# Patient Record
Sex: Male | Born: 1990 | Race: Black or African American | Hispanic: No | Marital: Single | Smoking: Never smoker
Health system: Southern US, Community
[De-identification: ages and names within clinical notes are randomized; demographics above are authoritative.]

---
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
## 2011-08-27 LAB — SAMPLE TO BLOOD BANK

## 2011-08-27 LAB — DIFFERENTIAL
Eosinophils Absolute: 0.1
Lymphs Abs: 0.9 — ABNORMAL LOW
Monocytes Relative: 6
Neutro Abs: 5.7
Neutrophils Relative %: 80 — ABNORMAL HIGH

## 2011-08-27 LAB — BASIC METABOLIC PANEL
CO2: 26
Calcium: 9.3
Glucose, Bld: 87
Potassium: 3.4 — ABNORMAL LOW

## 2011-08-27 LAB — URINALYSIS, ROUTINE W REFLEX MICROSCOPIC
Hgb urine dipstick: NEGATIVE
Nitrite: NEGATIVE
Protein, ur: NEGATIVE
Specific Gravity, Urine: 1.025
Urobilinogen, UA: 1

## 2011-08-27 LAB — CBC
HCT: 39.4
Hemoglobin: 13.7
MCV: 87.6
RBC: 4.49
RDW: 13.7
WBC: 7.1

## 2013-09-26 ENCOUNTER — Encounter (HOSPITAL_COMMUNITY): Payer: Self-pay | Admitting: Emergency Medicine

## 2013-09-26 ENCOUNTER — Emergency Department (INDEPENDENT_AMBULATORY_CARE_PROVIDER_SITE_OTHER)
Admission: EM | Admit: 2013-09-26 | Discharge: 2013-09-26 | Disposition: A | Discharge status: Home / Self Care | Payer: Self-pay | Source: Home / Self Care | Attending: Emergency Medicine | Admitting: Emergency Medicine

## 2013-09-26 DIAGNOSIS — J039 Acute tonsillitis, unspecified: Secondary | ICD-10-CM

## 2013-09-26 MED ORDER — AZITHROMYCIN 500 MG PO TABS: 500.0000 mg | ORAL_TABLET | Freq: Every day | ORAL | Status: DC

## 2013-09-26 MED ORDER — PREDNISONE 20 MG PO TABS: 20.0000 mg | ORAL_TABLET | Freq: Two times a day (BID) | ORAL | Status: DC

## 2013-09-26 NOTE — ED Notes (Signed)
C/o sore throat onset 2 days ago.  Noted white patches on his tonsils today. No fever.  Has a little runny nose. Had back pain the first day.

## 2013-09-26 NOTE — ED Provider Notes (Signed)
Chief Complaint:   Chief Complaint  Patient presents with  . Sore Throat    History of Present Illness:   Reginald Morris is a 22 year old male who's had a two-day history of sore throat, pain on swallowing, rhinorrhea, headache, and redness of the right eye. His wife is with him today with the same symptoms. She tested negative for strep. He denies any earache, swollen glands, coughing, wheezing, or GI symptoms. Other than his wife he's had no sick exposures and no definite exposure to strep or to mono.  Review of Systems:  Other than as noted above, the patient denies any of the following symptoms. Systemic:  No fever, chills, sweats, fatigue, myalgias, headache, or anorexia. Eye:  No redness, pain or drainage. ENT:  No earache, ear congestion, nasal congestion, sneezing, rhinorrhea, sinus pressure, sinus pain, or post nasal drip. Lungs:  No cough, sputum production, wheezing, shortness of breath, or chest pain. GI:  No abdominal pain, nausea, vomiting, or diarrhea. Skin:  No rash or itching.  PMFSH:  Past medical history, family history, social history, meds, allergies, and nurse's notes were reviewed.  There is no known exposure to strep or mono.  No prior history of step or mono.  The patient denies use of tobacco. He is allergic to penicillin.  Physical Exam:   Vital signs:  BP 120/55  Pulse 85  Temp(Src) 98.4 F (36.9 C) (Oral)  Resp 16  SpO2 100% General:  Alert, in no distress. Eye:  No conjunctival injection or drainage. Lids were normal. ENT:  TMs and canals were normal, without erythema or inflammation.  Nasal mucosa was clear and uncongested, without drainage.  Mucous membranes were moist.  Exam of pharynx reveals tonsils to be enlarged and red with spots of white exudate.  There were no oral ulcerations or lesions. Neck:  Supple, no adenopathy, tenderness or mass. Lungs:  No respiratory distress.  Lungs were clear to auscultation, without wheezes, rales or rhonchi.   Breath sounds were clear and equal bilaterally.  Heart:  Regular rhythm, without gallops, murmers or rubs. Skin:  Clear, warm, and dry, without rash or lesions.  Labs:   Results for orders placed during the hospital encounter of 09/26/13  POCT RAPID STREP A ([facility] ONLY)      Result Value Range   Streptococcus, Group A Screen (Direct) NEGATIVE  NEGATIVE   Assessment:  The encounter diagnosis was Tonsillitis.  Since he and his wife both tested negative for strep, it's likely that this is viral, however a backup culture is still pending, and I suggested that if he did not feel better in 3 or 4 days, or if the backup culture came back positive, he should go ahead and get in the buttocks and the prednisone filled. In the meantime he is going to gargle with hot salt water and use throat lozenges and ibuprofen.  Plan:   1.  Meds:  The following meds were prescribed:   Discharge Medication List as of 09/26/2013  6:17 PM    START taking these medications   Details  azithromycin (ZITHROMAX) 500 MG tablet Take 1 tablet (500 mg total) by mouth daily., Starting 09/26/2013, Until Discontinued, Normal    predniSONE (DELTASONE) 20 MG tablet Take 1 tablet (20 mg total) by mouth 2 (two) times daily., Starting 09/26/2013, Until Discontinued, Normal        2.  Patient Education/Counseling:  The patient was given appropriate handouts, self care instructions, and instructed in symptomatic relief, including hot  saline gargles, throat lozenges, infectious precautions, and need to trade out toothbrush.   3.  Follow up:  The patient was told to follow up if no better in 3 to 4 days, if becoming worse in any way, and given some red flag symptoms such as increasing fever difficulty swallowing or breathing which would prompt immediate return.  Follow up here if necessary.     Reuben Likes, MD 09/26/13 2106

## 2013-09-26 NOTE — ED Notes (Signed)
Bed: UC10 Expected date: 09/26/13 Expected time: 5:34 PM Means of arrival:  Comments:

## 2013-09-28 ENCOUNTER — Telehealth (HOSPITAL_COMMUNITY): Payer: Self-pay | Admitting: *Deleted

## 2013-09-28 LAB — CULTURE, GROUP A STREP

## 2013-09-28 NOTE — ED Notes (Signed)
Pt. called back.  Pt. verified x 2 and given results.  Pt. instructed to pick up his Rx. of Zithromax and take all of it. Drink plenty of water.  If anyone around him gets sick with the same symptoms to get checked for strep.  He is considered infectious until he has been on the antibiotics for 24 hrs.( approximately). Vassie Moselle 09/28/2013

## 2013-10-03 MED ORDER — PREDNISONE 20 MG PO TABS: 20.0000 mg | ORAL_TABLET | Freq: Two times a day (BID) | ORAL | Status: DC

## 2013-10-03 MED ORDER — AZITHROMYCIN 500 MG PO TABS: 500.0000 mg | ORAL_TABLET | Freq: Every day | ORAL | Status: DC

## 2015-02-10 ENCOUNTER — Encounter (HOSPITAL_COMMUNITY): Payer: Self-pay

## 2015-02-10 ENCOUNTER — Emergency Department (INDEPENDENT_AMBULATORY_CARE_PROVIDER_SITE_OTHER)
Admission: EM | Admit: 2015-02-10 | Discharge: 2015-02-10 | Disposition: A | Discharge status: Home / Self Care | Payer: 59 | Source: Home / Self Care | Attending: Emergency Medicine | Admitting: Emergency Medicine

## 2015-02-10 DIAGNOSIS — M79674 Pain in right toe(s): Secondary | ICD-10-CM | POA: Diagnosis not present

## 2015-02-10 NOTE — ED Provider Notes (Signed)
CSN: [id number]     Arrival date & time 02/10/15  [id number] History   First MD Initiated Contact with Patient 02/10/15 [phone]     Chief Complaint  Patient presents with  . Foot Pain   (Consider location/radiation/quality/duration/timing/severity/associated sxs/prior Treatment) HPI        24 year old male presents complaining of right toe pain. He was playing basketball 4 days ago and developed pain in the base of his right toe after playing. He had no known injury and no pain while he was playing. Since then he has pain with any flexion of the toe with walking, he is able to walk on the outside of his foot without much pain. The area did appear slightly swollen but that has gotten better. No history of a similar injury and no pain elsewhere  History reviewed. No pertinent past medical history. History reviewed. No pertinent past surgical history. History reviewed. No pertinent family history. History  Substance Use Topics  . Smoking status: Never Smoker   . Smokeless tobacco: Not on file  . Alcohol Use: No    Review of Systems  Musculoskeletal: Positive for arthralgias.       Right great toe pain   All other systems reviewed and are negative.   Allergies  Penicillins  Home Medications   Prior to Admission medications   Medication Sig Start Date End Date Taking? Authorizing Provider  azithromycin (ZITHROMAX) 500 MG tablet Take 1 tablet (500 mg total) by mouth daily. 10/03/13   Reuben Likesavid C Keller, MD  predniSONE (DELTASONE) 20 MG tablet Take 1 tablet (20 mg total) by mouth 2 (two) times daily. 10/03/13   Reuben Likesavid C Keller, MD   BP 135/84 mmHg  Pulse 82  Temp(Src) 98.9 F (37.2 C) (Oral)  Resp 12  SpO2 100% Physical Exam  Constitutional: He is oriented to person, place, and time. He appears well-developed and well-nourished. No distress.  HENT:  Head: Normocephalic.  Pulmonary/Chest: Effort normal. No respiratory distress.  Musculoskeletal:       Right ankle: Normal. He exhibits normal  pulse. Achilles tendon normal.       Right foot: There is tenderness (minimal tenderness around the base of the great toe on the right). There is normal range of motion, no swelling, normal capillary refill and no crepitus.  Neurological: He is alert and oriented to person, place, and time. Coordination normal.  Skin: Skin is warm and dry. No rash noted. He is not diaphoretic.  Psychiatric: He has a normal mood and affect. Judgment normal.  Nursing note and vitals reviewed.   ED Course  Procedures (including critical care time) Labs Review Labs Reviewed - No data to display  Imaging Review No results found.   MDM   1. Toe pain, right    Toe pain, this may be turf toe. I do not suspect any fracture. Treat with a postop shoe, ice, elevation. Follow-up if no improvement in a week for reevaluation      Graylon GoodZachary H Delorse Shane, PA-C 02/10/15 [phone]

## 2015-02-10 NOTE — Discharge Instructions (Signed)

## 2015-02-10 NOTE — ED Notes (Signed)
AFTER having played basketball , developed pain in left foot at great toe joint (MT). No known trauma at time of gameplay

## 2015-02-10 NOTE — ED Notes (Signed)
Great and second toes padded and buddy taped . Patient fitted w post op shoe

## 2018-11-21 ENCOUNTER — Ambulatory Visit (HOSPITAL_COMMUNITY)
Admission: EM | Admit: 2018-11-21 | Discharge: 2018-11-21 | Disposition: A | Discharge status: Home / Self Care | Payer: 59 | Attending: Family Medicine | Admitting: Family Medicine

## 2018-11-21 ENCOUNTER — Encounter (HOSPITAL_COMMUNITY): Payer: Self-pay

## 2018-11-21 DIAGNOSIS — R110 Nausea: Secondary | ICD-10-CM | POA: Diagnosis not present

## 2018-11-21 DIAGNOSIS — Z880 Allergy status to penicillin: Secondary | ICD-10-CM | POA: Insufficient documentation

## 2018-11-21 DIAGNOSIS — R197 Diarrhea, unspecified: Secondary | ICD-10-CM | POA: Insufficient documentation

## 2018-11-21 DIAGNOSIS — R05 Cough: Secondary | ICD-10-CM | POA: Diagnosis not present

## 2018-11-21 DIAGNOSIS — R0981 Nasal congestion: Secondary | ICD-10-CM | POA: Diagnosis not present

## 2018-11-21 DIAGNOSIS — R6889 Other general symptoms and signs: Secondary | ICD-10-CM

## 2018-11-21 DIAGNOSIS — J029 Acute pharyngitis, unspecified: Secondary | ICD-10-CM | POA: Insufficient documentation

## 2018-11-21 LAB — POCT RAPID STREP A: STREPTOCOCCUS, GROUP A SCREEN (DIRECT): NEGATIVE

## 2018-11-21 MED ORDER — ONDANSETRON HCL 4 MG PO TABS: 4.0000 mg | ORAL_TABLET | Freq: Four times a day (QID) | ORAL | 0 refills | Status: DC

## 2018-11-21 NOTE — Discharge Instructions (Signed)
Get plenty of rest and push fluids Zofran prescribed.  Take as needed for nausea You may use OTC zyrtec/ flonase as needed for nasal congestion, runny nose, and sore throat Use OTC medications like ibuprofen or tylenol as needed fever or pain [facility]CP [facility] if symptoms persist Return or go to ER if you have any new or worsening symptoms fever, chills, nausea, vomiting, chest pain, cough, shortness of breath, wheezing, abdominal pain, changes in bowel or bladder habits, etc...Marland Kitchen

## 2018-11-21 NOTE — ED Triage Notes (Signed)
Pt presents with sore throat, slight cough and generalized abdominal pain with diarrhea.

## 2018-11-21 NOTE — ED Provider Notes (Signed)
[facility]   [id number] 11/21/18 Arrival Time: [id number]   CC: Flu-like symptoms   SUBJECTIVE: History from: patient.  Reginald Morris is a 27 y.o. male who presents with abrupt onset of nasal congestion, sore throat, cough, nausea, and diarrhea x 2 episodes daily for the past 3 days.  Admits to positive sick exposure at home.  Has tried OTC medications without relief.  Symptoms are made worse at night.  Reports previous symptoms in the past.  Complains of subjective fever, chills, and fatigue. Denies sinus pain, SOB, wheezing, chest pain, or changes in bladder habits.    Received flu shot this year: yes.  ROS: As per HPI.  History reviewed. No pertinent past medical history. History reviewed. No pertinent surgical history. Allergies  Allergen Reactions  . Penicillins Rash   No current facility-administered medications on file prior to encounter.    No current outpatient medications on file prior to encounter.   Social History   Socioeconomic History  . Marital status: Single    Spouse name: Not on file  . Number of children: Not on file  . Years of education: Not on file  . Highest education level: Not on file  Occupational History  . Not on file  Social Needs  . Financial resource strain: Not on file  . Food insecurity:    Worry: Not on file    Inability: Not on file  . Transportation needs:    Medical: Not on file    Non-medical: Not on file  Tobacco Use  . Smoking status: Never Smoker  Substance and Sexual Activity  . Alcohol use: No  . Drug use: No  . Sexual activity: Not on file  Lifestyle  . Physical activity:    Days per week: Not on file    Minutes per session: Not on file  . Stress: Not on file  Relationships  . Social connections:    Talks on phone: Not on file    Gets together: Not on file    Attends religious service: Not on file    Active member of club or organization: Not on file    Attends meetings of clubs or organizations: Not  on file    Relationship status: Not on file  . Intimate partner violence:    Fear of current or ex partner: Not on file    Emotionally abused: Not on file    Physically abused: Not on file    Forced sexual activity: Not on file  Other Topics Concern  . Not on file  Social History Narrative  . Not on file   History reviewed. No pertinent family history.  OBJECTIVE:  Vitals:   11/21/18 0941  BP: 126/74  Pulse: 88  Resp: 20  Temp: 98 F (36.7 C)  TempSrc: Oral  SpO2: 100%     General appearance: alert; appears mildly fatigued, but nontoxic; speaking in full sentences and tolerating own secretions HEENT: NCAT; Ears: EACs clear, TMs pearly gray; Eyes: PERRL.  EOM grossly intact.  Nose: nares patent with mild clear rhinorrhea, turbinates erythematous and swollen, Throat: oropharynx clear, tonsils non erythematous or enlarged, uvula midline  Neck: supple without LAD Lungs: unlabored respirations, symmetrical air entry; cough: absent; no respiratory distress; CTAB Heart: regular rate and rhythm.  Radial pulses 2+ symmetrical bilaterally Abdomen: soft, nondistended, normal active bowel sounds; nontender to palpation; no guarding Skin: warm and dry Psychological: alert and cooperative; normal mood and affect  ASSESSMENT & PLAN:  1. Flu-like symptoms  Meds ordered this encounter  Medications  . ondansetron (ZOFRAN) 4 MG tablet    Sig: Take 1 tablet (4 mg total) by mouth every 6 (six) hours.    Dispense:  12 tablet    Refill:  0    Order Specific Question:   Supervising Provider    Answer:   Eustace MooreELSON, YVONNE SUE [[id number]]    Get plenty of rest and push fluids Zofran prescribed.  Take as needed for nausea You may use OTC zyrtec/ flonase as needed for nasal congestion, runny nose, and sore throat Use OTC medications like ibuprofen or tylenol as needed fever or pain Follow [facility] with PCP or [facility] if symptoms persist Return or go to ER if you have any new or  worsening symptoms fever, chills, nausea, vomiting, chest pain, cough, shortness of breath, wheezing, abdominal pain, changes in bowel or bladder habits, etc...  Reviewed expectations re: course of current medical issues. Questions answered. Outlined signs and symptoms indicating need for more acute intervention. Patient verbalized understanding. After Visit Summary given.         Rennis HardingWurst, Makalyn Lennox, PA-C 11/21/18 1102

## 2018-11-23 LAB — CULTURE, GROUP A STREP (THRC)

## 2020-10-25 ENCOUNTER — Encounter (HOSPITAL_COMMUNITY): Payer: Self-pay | Admitting: Emergency Medicine

## 2020-10-25 ENCOUNTER — Other Ambulatory Visit: Payer: Self-pay

## 2020-10-25 ENCOUNTER — Emergency Department (HOSPITAL_COMMUNITY)
Admission: EM | Admit: 2020-10-25 | Discharge: 2020-10-25 | Disposition: A | Discharge status: Home / Self Care | Payer: Self-pay | Attending: Emergency Medicine | Admitting: Emergency Medicine

## 2020-10-25 ENCOUNTER — Emergency Department (HOSPITAL_COMMUNITY): Payer: Self-pay

## 2020-10-25 DIAGNOSIS — Y92012 Bathroom of single-family (private) house as the place of occurrence of the external cause: Secondary | ICD-10-CM | POA: Insufficient documentation

## 2020-10-25 DIAGNOSIS — Y29XXXA Contact with blunt object, undetermined intent, initial encounter: Secondary | ICD-10-CM | POA: Insufficient documentation

## 2020-10-25 DIAGNOSIS — S01419A Laceration without foreign body of unspecified cheek and temporomandibular area, initial encounter: Secondary | ICD-10-CM | POA: Insufficient documentation

## 2020-10-25 DIAGNOSIS — S0990XA Unspecified injury of head, initial encounter: Secondary | ICD-10-CM

## 2020-10-25 DIAGNOSIS — S0181XA Laceration without foreign body of other part of head, initial encounter: Secondary | ICD-10-CM

## 2020-10-25 DIAGNOSIS — R42 Dizziness and giddiness: Secondary | ICD-10-CM | POA: Insufficient documentation

## 2020-10-25 MED ORDER — LIDOCAINE-EPINEPHRINE (PF) 2 %-1:200000 IJ SOLN
10.0000 mL | Freq: Once | INTRAMUSCULAR | Status: DC
  Filled 2020-10-25: qty 20

## 2020-10-25 NOTE — ED Provider Notes (Signed)
[facility]-EMERGENCY DEPT Provider Note   CSN: [id number] Arrival date & time: 10/25/20  6644     History Chief Complaint  Patient presents with  . Facial Laceration  . Dizziness    Reginald Morris is a 29 y.o. male with no significant past medical history presenting to the ED with a chief complaint of dizziness and laceration. At approximately 5 AM states that he was "half asleep" and was concerned that he was sleepwalking into the bathroom. He hit his face on the door frame. He denies any loss of consciousness but does have dizziness and lightheadedness since the incident. He does fully remember the incident. He does currently have a slight headache at the site of the laceration on his face. He denies any vision changes, vomiting, numbness in arms or legs, other injury, anticoagulant use, neck pain.  HPI     History reviewed. No pertinent past medical history.  There are no problems to display for this patient.   History reviewed. No pertinent surgical history.     No family history on file.  Social History   Tobacco Use  . Smoking status: Never Smoker  . Smokeless tobacco: Never Used  Substance Use Topics  . Alcohol use: Yes  . Drug use: No    Home Medications Prior to Admission medications   Not on File    Allergies    Penicillins  Review of Systems   Review of Systems  Constitutional: Negative for appetite change, chills and fever.  HENT: Negative for ear pain, rhinorrhea, sneezing and sore throat.   Eyes: Negative for photophobia and visual disturbance.  Respiratory: Negative for cough, chest tightness, shortness of breath and wheezing.   Cardiovascular: Negative for chest pain and palpitations.  Gastrointestinal: Negative for abdominal pain, blood in stool, constipation, diarrhea, nausea and vomiting.  Genitourinary: Negative for dysuria, hematuria and urgency.  Musculoskeletal: Negative for myalgias.  Skin: Positive for wound.  Negative for rash.  Neurological: Positive for dizziness, light-headedness and headaches. Negative for weakness.    Physical Exam Updated Vital Signs BP 126/76   Pulse 87   Temp 98.4 F (36.9 C) (Oral)   Resp 16   Ht 5\' 11"  (1.803 m)   Wt 113.4 kg   SpO2 100%   BMI 34.87 kg/m   Physical Exam Vitals and nursing note reviewed.  Constitutional:      General: He is not in acute distress.    Appearance: He is well-developed.  HENT:     Head: Normocephalic.      Nose: Nose normal.  Eyes:     General: No scleral icterus.       Left eye: No discharge.     Conjunctiva/sclera: Conjunctivae normal.  Cardiovascular:     Rate and Rhythm: Normal rate and regular rhythm.     Heart sounds: Normal heart sounds. No murmur heard.  No friction rub. No gallop.   Pulmonary:     Effort: Pulmonary effort is normal. No respiratory distress.     Breath sounds: Normal breath sounds.  Abdominal:     General: Bowel sounds are normal. There is no distension.     Palpations: Abdomen is soft.     Tenderness: There is no abdominal tenderness. There is no guarding.  Musculoskeletal:        General: Normal range of motion.     Cervical back: Normal range of motion and neck supple.  Skin:    General: Skin is warm and dry.  Findings: Laceration present. No rash.     Comments: 1 inch vertical laceration in the medial right eyebrow.  Neurological:     General: No focal deficit present.     Mental Status: He is alert and oriented to person, place, and time.     Cranial Nerves: No cranial nerve deficit.     Sensory: No sensory deficit.     Motor: No weakness or abnormal muscle tone.     Coordination: Coordination normal.     Comments: Pupils reactive. No facial asymmetry noted. Cranial nerves appear grossly intact. Sensation intact to light touch on face, BUE and BLE. Strength 5/5 in BUE and BLE.      ED Results / Procedures / Treatments   Labs (all labs ordered are listed, but only abnormal  results are displayed) Labs Reviewed - No data to display  EKG None  Radiology CT Head Wo Contrast  Result Date: 10/25/2020 CLINICAL DATA:  Head trauma, loss of consciousness. EXAM: CT HEAD WITHOUT CONTRAST TECHNIQUE: Contiguous axial images were obtained from the base of the skull through the vertex without intravenous contrast. COMPARISON:  None. FINDINGS: Brain: Ventricles are normal in size and configuration. All areas of the brain demonstrate appropriate gray-white matter differentiation. No mass, hemorrhage, edema or other evidence of acute parenchymal abnormality. No extra-axial hemorrhage. Vascular: No hyperdense vessel or unexpected calcification. Skull: Normal. Negative for fracture or focal lesion. Sinuses/Orbits: No acute finding. Other: None. IMPRESSION: Negative head CT. No intracranial mass, hemorrhage or edema. No skull fracture. Electronically Signed   By: Bary Richard M.D.   On: 10/25/2020 07:51    Procedures .Marland KitchenLaceration Repair  Date/Time: 10/25/2020 8:45 AM Performed by: Dietrich Pates, PA-C Authorized by: Dietrich Pates, PA-C   Consent:    Consent obtained:  Verbal   Consent given by:  Patient   Risks discussed:  Infection, need for additional repair, pain, poor cosmetic result, poor wound healing and nerve damage   Alternatives discussed:  No treatment Anesthesia (see MAR for exact dosages):    Anesthesia method:  Local infiltration   Local anesthetic:  Lidocaine 2% WITH epi Laceration details:    Location:  Face   Face location:  R eyebrow   Length (cm):  3 Repair type:    Repair type:  Simple Pre-procedure details:    Preparation:  Patient was prepped and draped in usual sterile fashion Treatment:    Area cleansed with:  Saline   Amount of cleaning:  Standard   Irrigation solution:  Sterile water   Irrigation method:  Syringe Skin repair:    Repair method:  Sutures   Suture size:  5-0   Suture material:  Nylon   Suture technique:  Simple interrupted    Number of sutures:  5 Approximation:    Approximation:  Close Post-procedure details:    Dressing:  Open (no dressing)   Patient tolerance of procedure:  Tolerated well, no immediate complications   (including critical care time)  Medications Ordered in ED Medications  lidocaine-EPINEPHrine (XYLOCAINE W/EPI) 2 %-1:200000 (PF) injection 10 mL (has no administration in time range)    ED Course  I have reviewed the triage vital signs and the nursing notes.  Pertinent labs & imaging results that were available during my care of the patient were reviewed by me and considered in my medical decision making (see chart for details).    MDM Rules/Calculators/A&P  29 year old male presenting to the ED with facial laceration and head injury. Believes that he was sleepwalking to the bathroom around 5 AM when he sustained this laceration by hitting a door frame. Reports dizziness, lightheadedness and headache. The symptoms have gradually improved since injury. He has a 1 inch vertical laceration to the right medial eyebrow area. He has no neurological deficits. He denies any loss of consciousness although nursing note states there was +LOC. He believes that he was just sleeping. Due to questionable LOC will obtain CT head. Patient will need laceration repair.  CT of the head is negative for acute abnormality. Pressure irrigation performed. Wound explored and base of wound visualized in a bloodless field without evidence of foreign body.  Laceration occurred < 8 hours prior to repair which was well tolerated. Tdap up to date. Pt has  no comorbidities to effect normal wound healing. Patient counseled on wound care. Patient counseled on need to return or see PCP/urgent care for suture removal in 5 days. Patient was urged to return to the Emergency Department urgently with worsening pain, swelling, expanding erythema especially if it streaks away from the affected area, fever, or if  they have any other concerns. Patient was counseled on head injury precautions and symptoms that should indicate their return to the ED.  These include severe worsening headache, vision changes, confusion, loss of consciousness, trouble walking, nausea & vomiting, or weakness/tingling in extremities.  Patient is hemodynamically stable, in NAD, and able to ambulate in the ED. Evaluation does not show pathology that would require ongoing emergent intervention or inpatient treatment. I explained the diagnosis to the patient. Pain has been managed and has no complaints prior to discharge. Patient is comfortable with above plan and is stable for discharge at this time. All questions were answered prior to disposition. Strict return precautions for returning to the ED were discussed. Encouraged follow up with PCP.   An After Visit Summary was printed and given to the patient.   Portions of this note were generated with Scientist, clinical (histocompatibility and immunogenetics). Dictation errors may occur despite best attempts at proofreading.   Final Clinical Impression(s) / ED Diagnoses Final diagnoses:  Facial laceration, initial encounter  Injury of head, initial encounter    Rx / DC Orders ED Discharge Orders    None       Dietrich Pates, PA-C 10/25/20 0847    Bethann Berkshire, MD 10/26/20 [phone]

## 2020-10-25 NOTE — ED Triage Notes (Signed)
Patient states he got up to use the restroom and was "half asleep" when he miscalculated the door way and hit his face on the door jam. Patient states +LOC, that he woke up on the floor. 1 inch vertical laceration noted to right eyebrow, bleeding controlled. Patient endorses dizziness.

## 2020-10-25 NOTE — Discharge Instructions (Addendum)
You will need to return in about 5 days to have your sutures removed. You can return to this ER, any other ER or urgent care center for removal. Follow instructions regarding laceration care and head injury precautions. Return to the ER if you start to experience signs of infection including redness, swelling, or worsening headache, blurry vision, numbness in arms or legs.

## 2021-07-23 ENCOUNTER — Ambulatory Visit (HOSPITAL_COMMUNITY)
Admission: EM | Admit: 2021-07-23 | Discharge: 2021-07-23 | Disposition: A | Discharge status: Home / Self Care | Payer: Self-pay | Attending: Medical Oncology | Admitting: Medical Oncology

## 2021-07-23 ENCOUNTER — Other Ambulatory Visit: Payer: Self-pay

## 2021-07-23 ENCOUNTER — Encounter (HOSPITAL_COMMUNITY): Payer: Self-pay

## 2021-07-23 DIAGNOSIS — U071 COVID-19: Secondary | ICD-10-CM | POA: Insufficient documentation

## 2021-07-23 DIAGNOSIS — J069 Acute upper respiratory infection, unspecified: Secondary | ICD-10-CM | POA: Insufficient documentation

## 2021-07-23 LAB — SARS CORONAVIRUS 2 (TAT 6-24 HRS): SARS Coronavirus 2: POSITIVE — AB

## 2021-07-23 MED ORDER — BENZONATATE 100 MG PO CAPS: 100.0000 mg | ORAL_CAPSULE | Freq: Three times a day (TID) | ORAL | 0 refills | Status: AC

## 2021-07-23 MED ORDER — FLUTICASONE PROPIONATE 50 MCG/ACT NA SUSP: 2.0000 | Freq: Every day | NASAL | 0 refills | Status: AC

## 2021-07-23 NOTE — ED Provider Notes (Signed)
[facility]    CSN: [id number] Arrival date & time: 07/23/21  8657      History   Chief Complaint Chief Complaint  Patient presents with   Cough   Nasal Congestion    HPI Cache Bills is a 30 y.o. male.   HPI  Cough: Pt reports symptoms of cough, nasal congestion for the past 3 days.  He has tried a home COVID test which was negative but asked for an in office test as well.  He denies any fever, chest pain, shortness of breath or hemoptysis.  He has not tried any medications for symptoms.   History reviewed. No pertinent past medical history.  There are no problems to display for this patient.   History reviewed. No pertinent surgical history.   Home Medications    Prior to Admission medications   Not on File    Family History History reviewed. No pertinent family history.  Social History Social History   Tobacco Use   Smoking status: Never   Smokeless tobacco: Never  Substance Use Topics   Alcohol use: Yes   Drug use: No     Allergies   Penicillins   Review of Systems Review of Systems  As stated above in HPI Physical Exam Triage Vital Signs ED Triage Vitals  Enc Vitals Group     BP 07/23/21 0901 128/77     Pulse Rate 07/23/21 0901 96     Resp 07/23/21 0901 18     Temp 07/23/21 0901 98.9 F (37.2 C)     Temp Source 07/23/21 0901 Oral     SpO2 07/23/21 0901 97 %     Weight --      Height --      Head Circumference --      Peak Flow --      Pain Score 07/23/21 0900 0     Pain Loc --      Pain Edu? --      Excl. in [place]? --    No data found.  Updated Vital Signs BP 128/77 (BP Location: Left Arm)   Pulse 96   Temp 98.9 F (37.2 C) (Oral)   Resp 18   SpO2 97%   Physical Exam Vitals and nursing note reviewed.  Constitutional:      General: He is not in acute distress.    Appearance: Normal appearance. He is not ill-appearing, toxic-appearing or diaphoretic.  HENT:     Head: Normocephalic and atraumatic.      Nose: Nose normal. No congestion or rhinorrhea.     Mouth/Throat:     Mouth: Mucous membranes are moist.     Pharynx: No oropharyngeal exudate or posterior oropharyngeal erythema.  Eyes:     Extraocular Movements: Extraocular movements intact.     Pupils: Pupils are equal, round, and reactive to light.  Cardiovascular:     Rate and Rhythm: Normal rate and regular rhythm.     Heart sounds: Normal heart sounds.  Pulmonary:     Effort: Pulmonary effort is normal.     Breath sounds: Normal breath sounds.  Musculoskeletal:     Cervical back: Normal range of motion and neck supple.  Lymphadenopathy:     Cervical: No cervical adenopathy.  Skin:    General: Skin is warm.  Neurological:     Mental Status: He is alert and oriented to person, place, and time.     UC Treatments / Results  Labs (all labs ordered are listed, but  only abnormal results are displayed) Labs Reviewed - No data to display  EKG   Radiology No results found.  Procedures Procedures (including critical care time)  Medications Ordered in UC Medications - No data to display  Initial Impression / Assessment and Plan / UC Course  I have reviewed the triage vital signs and the nursing notes.  Pertinent labs & imaging results that were available during my care of the patient were reviewed by me and considered in my medical decision making (see chart for details).     New and likely viral which should self resolve. Sending them home with tessalon and flonase to help with symptoms. Rest, hydration encouraged. Discussed red flag signs and symptoms. Follow up PRN.   Final Clinical Impressions(s) / UC Diagnoses   Final diagnoses:  None   Discharge Instructions   None    ED Prescriptions   None    PDMP not reviewed this encounter.   Rushie Chestnut, [place] 07/23/21 [phone]

## 2021-07-23 NOTE — ED Triage Notes (Signed)
Pt reports cough and nasal congestion x 3 days. Reports negative COVID test (home).

## 2021-08-14 ENCOUNTER — Other Ambulatory Visit: Payer: Self-pay

## 2021-08-14 ENCOUNTER — Emergency Department (HOSPITAL_COMMUNITY)
Admission: EM | Admit: 2021-08-14 | Discharge: 2021-08-14 | Disposition: A | Discharge status: Home / Self Care | Payer: Self-pay | Attending: Emergency Medicine | Admitting: Emergency Medicine

## 2021-08-14 ENCOUNTER — Encounter (HOSPITAL_COMMUNITY): Payer: Self-pay

## 2021-08-14 DIAGNOSIS — J3489 Other specified disorders of nose and nasal sinuses: Secondary | ICD-10-CM | POA: Insufficient documentation

## 2021-08-14 DIAGNOSIS — Z20822 Contact with and (suspected) exposure to covid-19: Secondary | ICD-10-CM | POA: Insufficient documentation

## 2021-08-14 DIAGNOSIS — J069 Acute upper respiratory infection, unspecified: Secondary | ICD-10-CM | POA: Insufficient documentation

## 2021-08-14 LAB — MONONUCLEOSIS SCREEN: Mono Screen: NEGATIVE

## 2021-08-14 LAB — RESP PANEL BY RT-PCR (FLU A&B, COVID) ARPGX2
Influenza A by PCR: NEGATIVE
Influenza B by PCR: NEGATIVE
SARS Coronavirus 2 by RT PCR: NEGATIVE

## 2021-08-14 LAB — GROUP A STREP BY PCR: Group A Strep by PCR: NOT DETECTED

## 2021-08-14 MED ORDER — ACETAMINOPHEN 325 MG PO TABS
650.0000 mg | ORAL_TABLET | Freq: Once | ORAL | Status: AC
  Administered 2021-08-14: 650 mg via ORAL
  Filled 2021-08-14: qty 2

## 2021-08-14 NOTE — ED Provider Notes (Signed)
[facility] EMERGENCY DEPARTMENT Provider Note   CSN: [id number] Arrival date & time: 08/14/21  0732     History Chief Complaint  Patient presents with   Sore Throat   Nasal Congestion    Reginald Morris is a 30 y.o. male otherwise healthy male arrives today for evaluation of rhinorrhea, nasal congestion, sore throat and nonproductive cough onset 2-3 days ago.  Patient describes sore throat as a mild bilateral scratchy sensation when he swallows improves with rest, pain does not radiate.  Patient reports nonproductive cough without associated chest pain or shortness of breath or hemoptysis.  Patient is requesting a COVID test however he does report that he tested positive for COVID around 3 weeks ago when he was experiencing similar symptoms.  Patient reports a complete resolution of prior URI after a few days 3 weeks ago and has been asymptomatic for over 2 weeks.  Denies fever, vision change, neck stiffness, difficulty speaking, difficulty swallowing, abdominal pain, nausea/vomiting, diarrhea, body aches, hemoptysis, chest pain/shortness of breath, extremity swelling or any additional concerns  HPI     History reviewed. No pertinent past medical history.  There are no problems to display for this patient.   History reviewed. No pertinent surgical history.     No family history on file.  Social History   Tobacco Use   Smoking status: Never   Smokeless tobacco: Never  Substance Use Topics   Alcohol use: Yes   Drug use: No    Home Medications Prior to Admission medications   Medication Sig Start Date End Date Taking? Authorizing Provider  benzonatate (TESSALON) 100 MG capsule Take 1 capsule (100 mg total) by mouth every 8 (eight) hours. 07/23/21   Rushie Chestnut, PA-C  fluticasone (FLONASE) 50 MCG/ACT nasal spray Place 2 sprays into both nostrils daily. 07/23/21   Rushie Chestnut, PA-C    Allergies    Penicillins  Review of Systems   Review  of Systems  Constitutional: Negative.  Negative for chills and fever.  HENT:  Positive for congestion, rhinorrhea and sore throat. Negative for facial swelling, trouble swallowing and voice change.   Respiratory:  Positive for cough. Negative for shortness of breath.   Cardiovascular: Negative.  Negative for chest pain and leg swelling.  Gastrointestinal: Negative.  Negative for abdominal pain, diarrhea, nausea and vomiting.  Musculoskeletal: Negative.  Negative for neck pain.  Neurological: Negative.  Negative for weakness and numbness.   Physical Exam Updated Vital Signs BP 131/85 (BP Location: Left Arm)   Pulse 73   Temp 98.3 F (36.8 C) (Oral)   Resp 16   Ht 5\' 10"  (1.778 m)   Wt 108.9 kg   SpO2 100%   BMI 34.44 kg/m   Physical Exam Constitutional:      General: He is not in acute distress.    Appearance: Normal appearance. He is well-developed. He is not ill-appearing or diaphoretic.  HENT:     Head: Normocephalic and atraumatic.     Jaw: There is normal jaw occlusion. No trismus.     Nose: Rhinorrhea present. Rhinorrhea is clear.     Mouth/Throat:     Mouth: Mucous membranes are moist.     Pharynx: Oropharynx is clear.     Tonsils: No tonsillar exudate or tonsillar abscesses.  Eyes:     General: Vision grossly intact. Gaze aligned appropriately.     Pupils: Pupils are equal, round, and reactive to light.  Neck:     Trachea: Trachea  and phonation normal.  Cardiovascular:     Rate and Rhythm: Normal rate and regular rhythm.  Pulmonary:     Effort: Pulmonary effort is normal. No respiratory distress.     Breath sounds: Normal breath sounds.  Abdominal:     General: There is no distension.     Palpations: Abdomen is soft.     Tenderness: There is no abdominal tenderness. There is no guarding or rebound.  Musculoskeletal:        General: Normal range of motion.     Cervical back: Normal range of motion.     Right lower leg: No edema.     Left lower leg: No edema.   Skin:    General: Skin is warm and dry.  Neurological:     Mental Status: He is alert.     GCS: GCS eye subscore is 4. GCS verbal subscore is 5. GCS motor subscore is 6.     Comments: Speech is clear and goal oriented, follows commands Major Cranial nerves without deficit, no facial droop Moves extremities without ataxia, coordination intact  Psychiatric:        Behavior: Behavior normal.    ED Results / Procedures / Treatments   Labs (all labs ordered are listed, but only abnormal results are displayed) Labs Reviewed  GROUP A STREP BY PCR  RESP PANEL BY RT-PCR (FLU A&B, COVID) [id number]  MONONUCLEOSIS SCREEN    EKG None  Radiology No results found.  Procedures Procedures   Medications Ordered in ED Medications  acetaminophen (TYLENOL) tablet 650 mg (650 mg Oral Given 08/14/21 1497)    ED Course  I have reviewed the triage vital signs and the nursing notes.  Pertinent labs & imaging results that were available during my care of the patient were reviewed by me and considered in my medical decision making (see chart for details).    MDM Rules/Calculators/A&P                          Additional history obtained from: Nursing notes from this visit. Review of electronic medical records. ------------------ 30 year old otherwise healthy male presented today for evaluation of URI symptoms, rhinorrhea, sore throat, nasal congestion and nonproductive cough for the past few days.  On examination he is well-appearing no acute distress.  Cranial nerves intact.  No meningeal signs.  Airway clear without evidence of PTA, RPA, Ludewig's, dental abscess, tonsillitis or other deep space infections of the head or neck.  Cardiopulmonary examination is unremarkable.  Abdomen is soft nontender.  He is neurovascular intact to the lower extremities without evidence of DVT.  Suspect patient is experiencing a viral upper respiratory tract infection at this time.  He did test positive for COVID  around 3 weeks ago but reports complete resolution of that illness after a few days and has been asymptomatic for the past 2 weeks.  Of low suspicion for bacterial infection at this time.  Patient is requesting flu test, I attempted to order this however at this facility it is bundled up with the COVID test so all 3 were ordered.  I have also added on a strep test and Monospot test for the patient. - I ordered, reviewed and interpreted labs which include: COVID/influenza panel negative. Strep test negative. Monospot negative.  Patient reevaluated he is sitting up in bed no acute distress vital signs are stable.  Patient requesting discharge.  I have low suspicion for bacterial infection requiring antibiotics at this  time.  Suspect patient with viral URI, given cough is nonproductive only ongoing for the past few days and without associated shortness of breath do not feel rate is indicated this time, discussed this with the patient and he agrees and does not want a chest x-ray.  Patient continue to monitor his symptoms at home, history with OTC anti-inflammatories, hydration and rest.  He will follow-up with his primary care provider.  Patient was given a work note.  Doubt bacterial sinusitis, bacterial pharyngitis, PTA, RPA, Ludwick's, meningitis, pneumonia or other emergent pathology of patient's symptoms at this time   At this time there does not appear to be any evidence of an acute emergency medical condition and the patient appears stable for discharge with appropriate outpatient follow up. Diagnosis was discussed with patient who verbalizes understanding of care plan and is agreeable to discharge. I have discussed return precautions with patient who verbalizes understanding. Patient encouraged to follow-up with their PCP. All questions answered.  Patient's case discussed with Dr. Durwin Nora who agrees with plan to discharge with follow-up.   Note: Portions of this report may have been transcribed  using voice recognition software. Every effort was made to ensure accuracy; however, inadvertent computerized transcription errors may still be present.  Final Clinical Impression(s) / ED Diagnoses Final diagnoses:  Viral upper respiratory tract infection    Rx / DC Orders ED Discharge Orders     None        Elizabeth Palau 08/14/21 1013    Gloris Manchester, MD 08/15/21 [phone]

## 2021-08-14 NOTE — ED Triage Notes (Signed)
Pt reports scratchy throat, nasal congestion and cough for the past few days, requesting a covid test. Pt has not had any covid or flu vaccines. Resp e.u

## 2021-08-14 NOTE — Discharge Instructions (Addendum)
At this time there does not appear to be the presence of an emergent medical condition, however there is always the potential for conditions to change. Please read and follow the below instructions.  Please return to the Emergency Department immediately for any new or worsening symptoms. Please be sure to follow up with your Primary Care Provider within one week regarding your visit today; please call their office to schedule an appointment even if you are feeling better for a follow-up visit.  If you do not have a primary care provider you may call the phone number under [facility] on discharge paperwork to establish 1. Please drink plenty of water and get plenty of rest to help with your symptoms.  You may use over-the-counter anti-inflammatory such as Tylenol as directed on the packaging to help with your symptoms.  Go to the nearest Emergency Department immediately if: You have fever or chills You have trouble breathing. You have very bad or constant: Headache. Ear pain. Pain in your forehead, behind your eyes, and over your cheekbones (sinus pain). Chest pain. You have long-lasting (chronic) lung disease along with any of these: Wheezing. Long-lasting cough. Coughing up blood. A change in your usual mucus. You have a stiff neck. You have changes in your: Vision. Hearing. Thinking. Mood. You have any new/concerning or worsening of symptoms   Please read the additional information packets attached to your discharge summary.  Do not take your medicine if  develop an itchy rash, swelling in your mouth or lips, or difficulty breathing; call 911 and seek immediate emergency medical attention if this occurs.  You may review your lab tests and imaging results in their entirety on your MyChart account.  Please discuss all results of fully with your primary care provider and other specialist at your follow-up visit.  Note: Portions of this text may have  been transcribed using voice recognition software. Every effort was made to ensure accuracy; however, inadvertent computerized transcription errors may still be present.

## 2022-04-16 IMAGING — CT CT HEAD W/O CM
3 series · 15 of 47 positions shown, 18 images · non-contrast
Comparison: None.

CLINICAL DATA: Head trauma, loss of consciousness.

EXAM:
CT HEAD WITHOUT CONTRAST
TECHNIQUE: Contiguous axial images were obtained from the base of the skull
through the vertex without intravenous contrast.

[Series 3: head wo · axial · 0.47mm/px · z∈[-117,+8]mm · 9 of 31 slices shown, 12 images]
[im 3/31  brain]
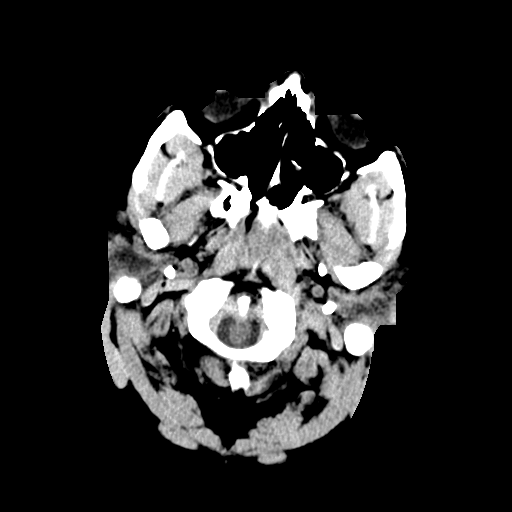
[im 3/31  bone]
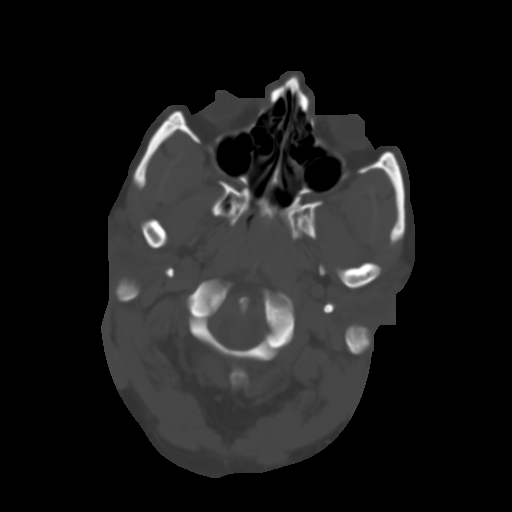
[im 6/31  brain]
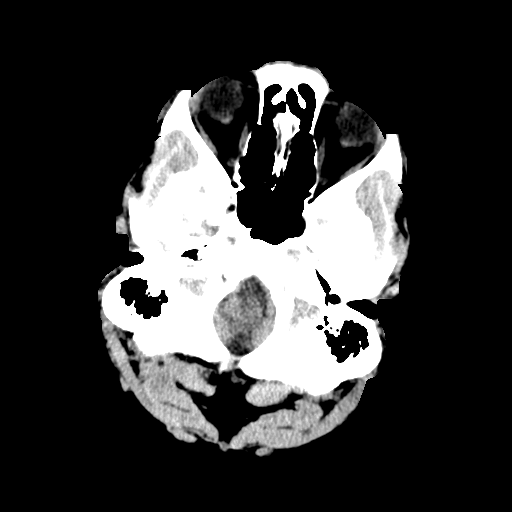
[im 9/31  brain]
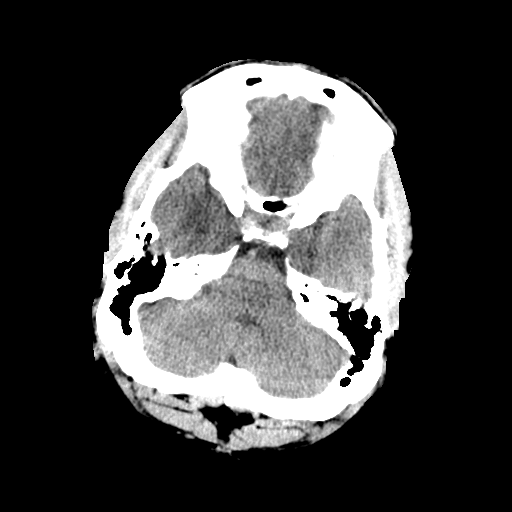
[im 12/31  brain]
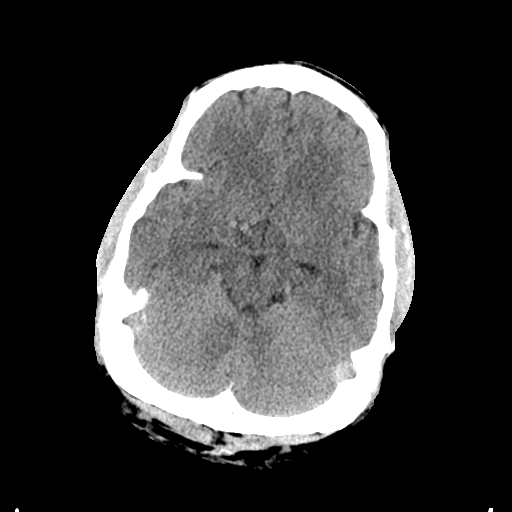
[im 16/31  brain]
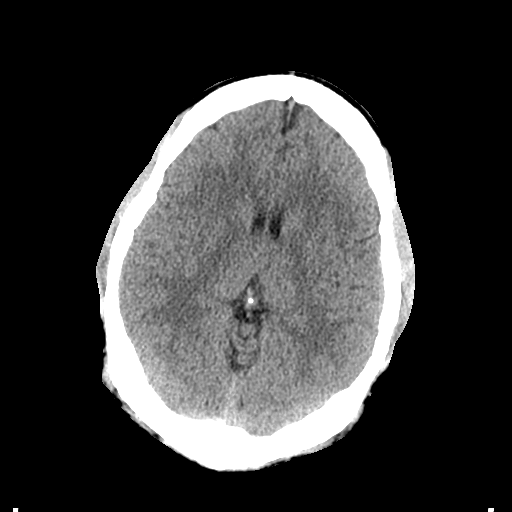
[im 16/31  bone]
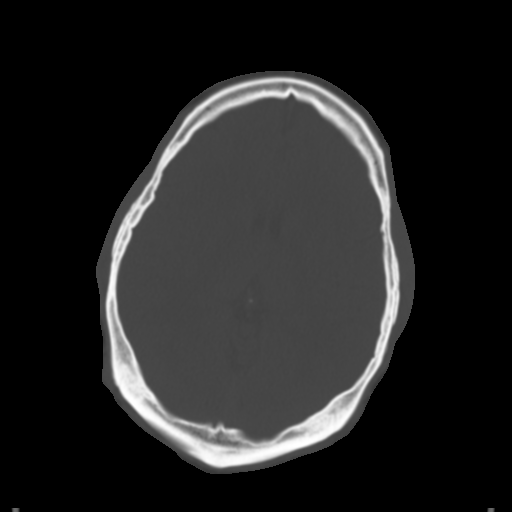
[im 19/31  brain]
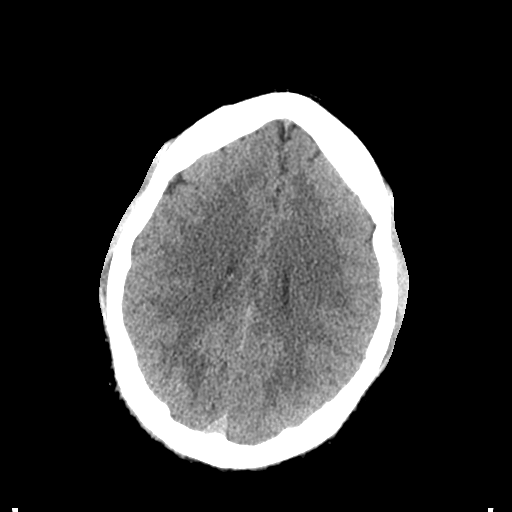
[im 22/31  brain]
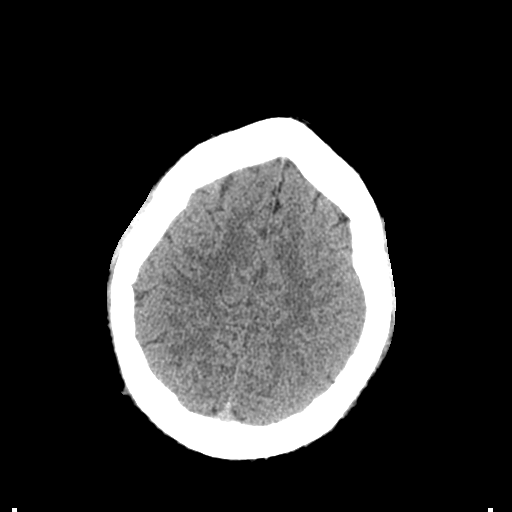
[im 25/31  brain]
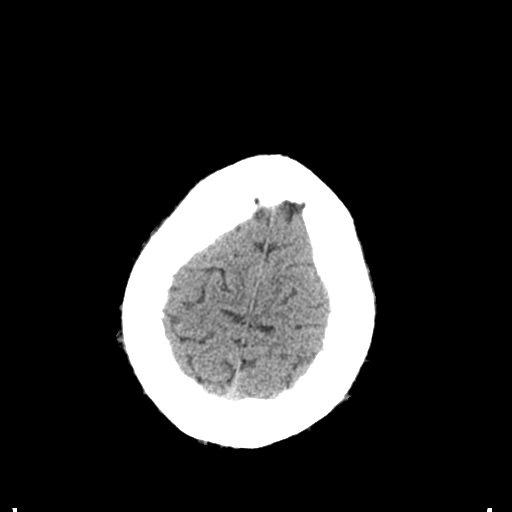
[im 28/31  brain]
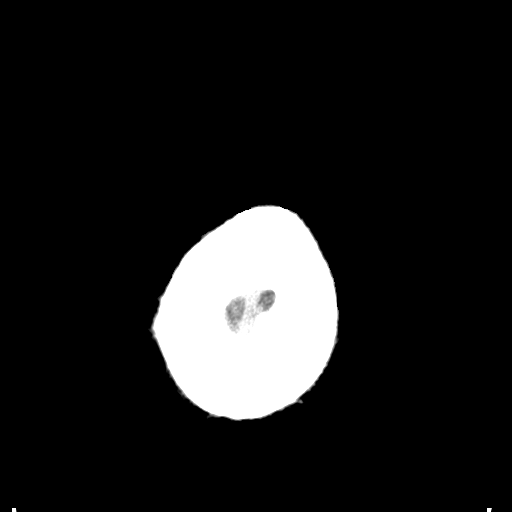
[im 28/31  bone]
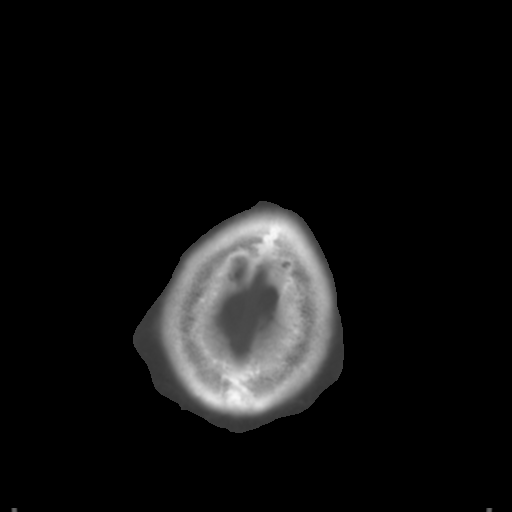

[Series 5: coronal soft tissue · coronal · 0.33mm/px · 3 of 82 slices shown]
[im 28/82  brain]
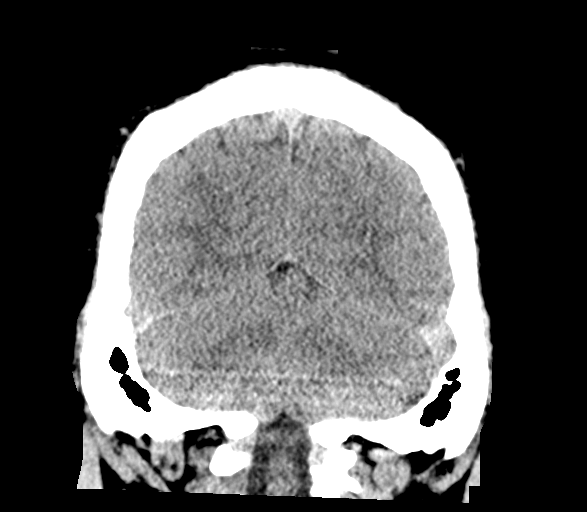
[im 37/82  brain]
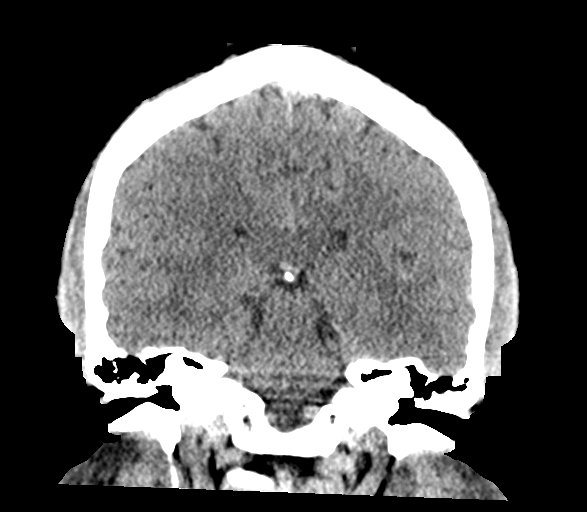
[im 46/82  brain]
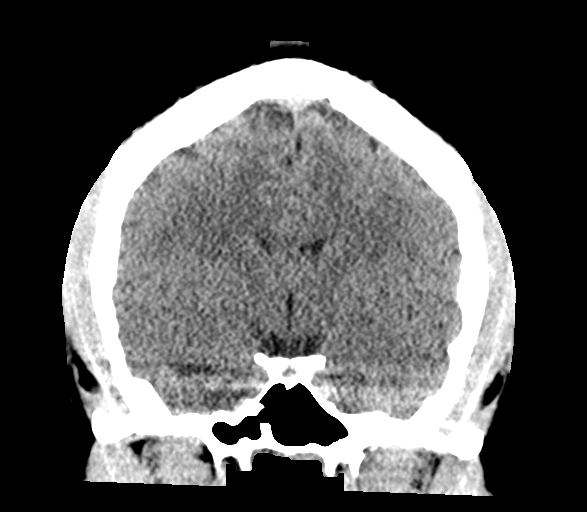

[Series 6: sagittal soft tissue · sagittal · 0.33mm/px · 3 of 66 slices shown]
[im 22/66  brain]
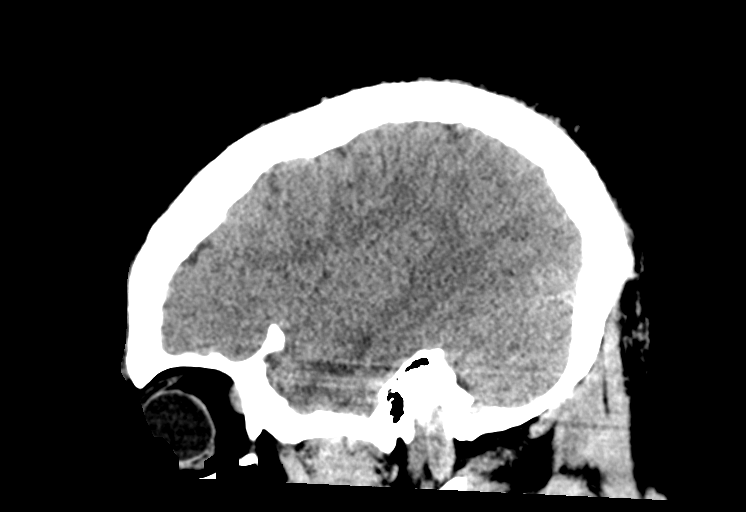
[im 33/66  brain]
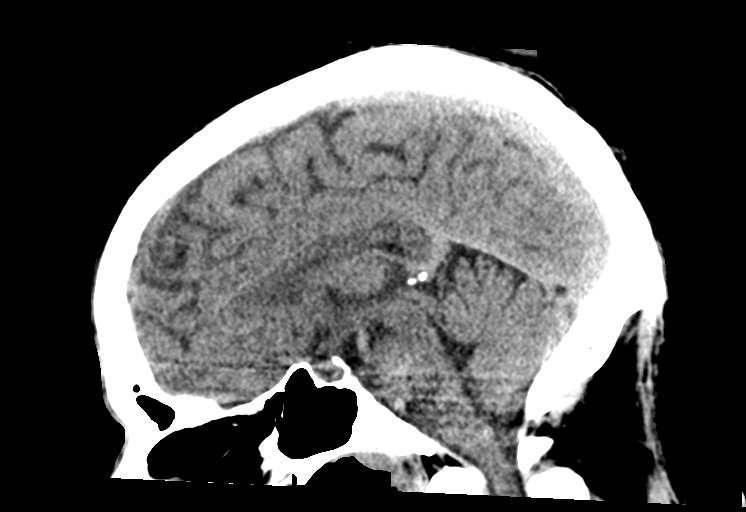
[im 44/66  brain]
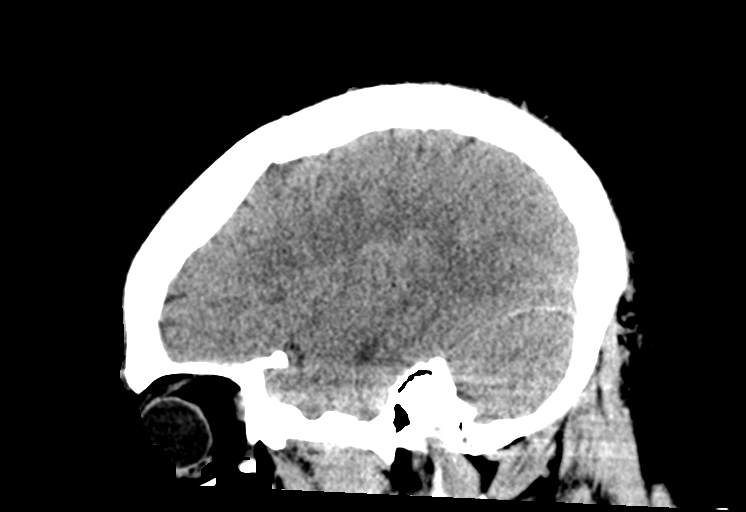

[15 of 47 positions shown; findings below may reference images not displayed]

FINDINGS: Brain: Ventricles are normal in size and configuration. All areas of
the brain demonstrate appropriate gray-white matter differentiation.
No mass, hemorrhage, edema or other evidence of acute parenchymal
abnormality. No extra-axial hemorrhage.

Vascular: No hyperdense vessel or unexpected calcification.

Skull: Normal. Negative for fracture or focal lesion.

Sinuses/Orbits: No acute finding.

Other: None.
IMPRESSION: Negative head CT. No intracranial mass, hemorrhage or edema. No
skull fracture.

## 2022-08-28 ENCOUNTER — Ambulatory Visit
Admission: EM | Admit: 2022-08-28 | Discharge: 2022-08-28 | Disposition: A | Discharge status: Home / Self Care | Payer: BC Managed Care – PPO | Attending: Internal Medicine | Admitting: Internal Medicine

## 2022-08-28 ENCOUNTER — Encounter: Payer: Self-pay | Admitting: Emergency Medicine

## 2022-08-28 DIAGNOSIS — M940 Chondrocostal junction syndrome [Tietze]: Secondary | ICD-10-CM | POA: Diagnosis present

## 2022-08-28 DIAGNOSIS — R0789 Other chest pain: Secondary | ICD-10-CM | POA: Insufficient documentation

## 2022-08-28 MED ORDER — IBUPROFEN 600 MG PO TABS: 600.0000 mg | ORAL_TABLET | Freq: Four times a day (QID) | ORAL | 0 refills | Status: AC | PRN

## 2022-08-28 NOTE — Discharge Instructions (Signed)
It appears that you have costochondritis which is an inflammation of your chest.  I have prescribed ibuprofen to take as needed for this.  Please do not take any additional ibuprofen, Advil, Aleve.  Follow-up with the emergency department if symptoms persist or worsen.

## 2022-08-28 NOTE — ED Provider Notes (Signed)
[facility]    CSN: [id number] Arrival date & time: 08/28/22  1313      History   Chief Complaint Chief Complaint  Patient presents with   Chest Pain   Headache    HPI Reginald Morris is a 31 y.o. male.   Patient presents with central chest pain that started yesterday.  Patient reports that it is a sharp, intermittent pain that is rated 8/10 on pain scale when it occurs.  Denies any current chest pain.  Patient denies any aggravating or relieving factors  to chest pain and state it occurs at random times.  Denies any history of cardiac problems.  Has not taken any medications to help alleviate pain.  Denies any associated shortness of breath, dizziness, blurred vision, nausea, vomiting but does report some intermittent headache that does not occur at the same time as chest pain.  Headache is present in the left forehead and extends into the left side of the head.  Denies any current headache.  Denies any chronic health problems and does not report taking any prescription medications.  Denies any recent injuries, falls, head injuries.   Chest Pain Headache   History reviewed. No pertinent past medical history.  There are no problems to display for this patient.   History reviewed. No pertinent surgical history.     Home Medications    Prior to Admission medications   Medication Sig Start Date End Date Taking? Authorizing Provider  ibuprofen (ADVIL) 600 MG tablet Take 1 tablet (600 mg total) by mouth every 6 (six) hours as needed for mild pain. 08/28/22  Yes Tiffanny Lamarche, Hildred Alamin E, FNP  benzonatate (TESSALON) 100 MG capsule Take 1 capsule (100 mg total) by mouth every 8 (eight) hours. 07/23/21   Hughie Closs, PA-C  fluticasone (FLONASE) 50 MCG/ACT nasal spray Place 2 sprays into both nostrils daily. 07/23/21   Hughie Closs, PA-C    Family History History reviewed. No pertinent family history.  Social History Social History   Tobacco Use   Smoking  status: Never   Smokeless tobacco: Never  Substance Use Topics   Alcohol use: Yes   Drug use: No     Allergies   Penicillins   Review of Systems Review of Systems Per HPI  Physical Exam Triage Vital Signs ED Triage Vitals [08/28/22 1339]  Enc Vitals Group     BP 136/85     Pulse Rate 78     Resp 18     Temp 98.1 F (36.7 C)     Temp src      SpO2 97 %     Weight      Height      Head Circumference      Peak Flow      Pain Score 5     Pain Loc      Pain Edu?      Excl. in [place]?    No data found.  Updated Vital Signs BP 136/85   Pulse 78   Temp 98.1 F (36.7 C)   Resp 18   SpO2 97%   Visual Acuity Right Eye Distance:   Left Eye Distance:   Bilateral Distance:    Right Eye Near:   Left Eye Near:    Bilateral Near:     Physical Exam Constitutional:      General: He is not in acute distress.    Appearance: Normal appearance. He is not toxic-appearing or diaphoretic.  HENT:  Head: Normocephalic and atraumatic.  Eyes:     Extraocular Movements: Extraocular movements intact.     Conjunctiva/sclera: Conjunctivae normal.     Pupils: Pupils are equal, round, and reactive to light.  Cardiovascular:     Rate and Rhythm: Normal rate and regular rhythm.     Pulses: Normal pulses.     Heart sounds: Normal heart sounds.  Pulmonary:     Effort: Pulmonary effort is normal. No respiratory distress.     Breath sounds: Normal breath sounds.  Chest:     Chest wall: Tenderness present. No crepitus or edema.       Comments: Tenderness to palpation to center chest with slight extension into bilateral chest. No discoloration, swelling, lacerations, abrasions noted.  Neurological:     General: No focal deficit present.     Mental Status: He is alert and oriented to person, place, and time. Mental status is at baseline.     Cranial Nerves: Cranial nerves 2-12 are intact.     Sensory: Sensation is intact.     Motor: Motor function is intact.     Coordination:  Coordination is intact.     Gait: Gait is intact.  Psychiatric:        Mood and Affect: Mood normal.        Behavior: Behavior normal.        Thought Content: Thought content normal.        Judgment: Judgment normal.      UC Treatments / Results  Labs (all labs ordered are listed, but only abnormal results are displayed) Labs Reviewed - No data to display  EKG   Radiology No results found.  Procedures Procedures (including critical care time)  Medications Ordered in UC Medications - No data to display  Initial Impression / Assessment and Plan / UC Course  I have reviewed the triage vital signs and the nursing notes.  Pertinent labs & imaging results that were available during my care of the patient were reviewed by me and considered in my medical decision making (see chart for details).     EKG was sinus rhythm with occasional Sinus arrhythmia which is most likely normal variant in this age. Low concern for cardiac etiology given pain is reproducible with palpation. Do not think that any further workup for cardiac is necessary due to this. Vital signs are also stable.  Unsure if patient's headaches are related but patient denies current headache and neuro exam is normal so do not think that emergent evaluation or any imaging of the head is necessary at this time. Advised supportive care and NSAIDs for patient. Advised patient to follow up at the ER if symptoms persist worsen. Patient verbalized understanding and was agreeable with plan.  Final Clinical Impressions(s) / UC Diagnoses   Final diagnoses:  Costochondritis  Musculoskeletal chest pain     Discharge Instructions      It appears that you have costochondritis which is an inflammation of your chest.  I have prescribed ibuprofen to take as needed for this.  Please do not take any additional ibuprofen, Advil, Aleve.  Follow-up with the emergency department if symptoms persist or worsen.    ED Prescriptions      Medication Sig Dispense Auth. Provider   ibuprofen (ADVIL) 600 MG tablet Take 1 tablet (600 mg total) by mouth every 6 (six) hours as needed for mild pain. 30 tablet [place], Acie Fredrickson, [place]      PDMP not reviewed this encounter.  Gustavus Bryant, [place] 08/28/22 1422

## 2022-08-28 NOTE — ED Triage Notes (Signed)
Pt is present today with central chest pain and HA. Pt sx started yesterday. Pt denies any injury Pt describes the pain as sharp and on ad off.

## 2023-01-25 ENCOUNTER — Emergency Department (HOSPITAL_COMMUNITY)
Admission: EM | Admit: 2023-01-25 | Discharge: 2023-01-25 | Disposition: A | Discharge status: Home / Self Care | Payer: BC Managed Care – PPO | Attending: Emergency Medicine | Admitting: Emergency Medicine

## 2023-01-25 ENCOUNTER — Emergency Department (HOSPITAL_COMMUNITY): Payer: BC Managed Care – PPO

## 2023-01-25 ENCOUNTER — Encounter (HOSPITAL_COMMUNITY): Payer: Self-pay | Admitting: *Deleted

## 2023-01-25 ENCOUNTER — Ambulatory Visit (HOSPITAL_COMMUNITY)
Admission: EM | Admit: 2023-01-25 | Discharge: 2023-01-25 | Disposition: A | Discharge status: Other Acute Inpatient Hospital | Payer: BC Managed Care – PPO | Attending: Family Medicine | Admitting: Family Medicine

## 2023-01-25 ENCOUNTER — Other Ambulatory Visit: Payer: Self-pay

## 2023-01-25 ENCOUNTER — Encounter (HOSPITAL_COMMUNITY): Payer: Self-pay | Admitting: Emergency Medicine

## 2023-01-25 DIAGNOSIS — R0789 Other chest pain: Secondary | ICD-10-CM | POA: Diagnosis not present

## 2023-01-25 DIAGNOSIS — R42 Dizziness and giddiness: Secondary | ICD-10-CM

## 2023-01-25 DIAGNOSIS — R079 Chest pain, unspecified: Secondary | ICD-10-CM

## 2023-01-25 LAB — COMPREHENSIVE METABOLIC PANEL
ALT: 21 U/L (ref 0–44)
AST: 33 U/L (ref 15–41)
Albumin: 4.4 g/dL (ref 3.5–5.0)
Alkaline Phosphatase: 43 U/L (ref 38–126)
Anion gap: 10 (ref 5–15)
BUN: 20 mg/dL (ref 6–20)
CO2: 27 mmol/L (ref 22–32)
Calcium: 9.9 mg/dL (ref 8.9–10.3)
Chloride: 101 mmol/L (ref 98–111)
Creatinine, Ser: 1.08 mg/dL (ref 0.61–1.24)
GFR, Estimated: >60 mL/min (ref >60)
Glucose, Bld: 97 mg/dL (ref 70–99)
Potassium: 4.3 mmol/L (ref 3.5–5.1)
Sodium: 138 mmol/L (ref 135–145)
Total Bilirubin: 1.4 mg/dL — ABNORMAL HIGH (ref 0.3–1.2)
Total Protein: 8.2 g/dL — ABNORMAL HIGH (ref 6.5–8.1)

## 2023-01-25 LAB — CBC WITH DIFFERENTIAL/PLATELET
Abs Immature Granulocytes: 0.02 10*3/uL (ref 0.00–0.07)
Basophils Absolute: 0.1 10*3/uL (ref 0.0–0.1)
Basophils Relative: 1 %
Eosinophils Absolute: 0 10*3/uL (ref 0.0–0.5)
Eosinophils Relative: 1 %
HCT: 42.4 % (ref 39.0–52.0)
Hemoglobin: 14 g/dL (ref 13.0–17.0)
Immature Granulocytes: 0 %
Lymphocytes Relative: 29 %
Lymphs Abs: 2.2 10*3/uL (ref 0.7–4.0)
MCH: 31.9 pg (ref 26.0–34.0)
MCHC: 33 g/dL (ref 30.0–36.0)
MCV: 96.6 fL (ref 80.0–100.0)
Monocytes Absolute: 0.8 10*3/uL (ref 0.1–1.0)
Monocytes Relative: 10 %
Neutro Abs: 4.6 10*3/uL (ref 1.7–7.7)
Neutrophils Relative %: 59 %
Platelets: 327 10*3/uL (ref 150–400)
RBC: 4.39 MIL/uL (ref 4.22–5.81)
RDW: 12.5 % (ref 11.5–15.5)
WBC: 7.8 10*3/uL (ref 4.0–10.5)
nRBC: 0 % (ref 0.0–0.2)

## 2023-01-25 LAB — TROPONIN I (HIGH SENSITIVITY)
Troponin I (High Sensitivity): 6 ng/L (ref <18)
Troponin I (High Sensitivity): 5 ng/L (ref <18)

## 2023-01-25 NOTE — Discharge Instructions (Addendum)
Schedule to see primary care for recheck.  Return if any problems.

## 2023-01-25 NOTE — ED Triage Notes (Signed)
Pt reports eating lunch at approx 1430 . Pt was going to do a couple of laps around the building until he had chest tightness,dizzy  and blurred vision. On arrival  to [facility]  Pt reports CP and Pt can hear heart beating.

## 2023-01-25 NOTE — ED Provider Notes (Signed)
[facility] Provider Note   CSN: [id number] Arrival date & time: 01/25/23  1521     History  Chief Complaint  Patient presents with   Chest Pain    Ryheem Ferroni is a 32 y.o. male.  Pt complains of chest pain today.    The history is provided by the patient. No language interpreter was used.  Chest Pain Pain location:  Substernal area Pain quality: aching   Pain radiates to:  Does not radiate Pain severity:  Moderate Onset quality:  Gradual Duration:  5 minutes Timing:  Constant Progression:  Worsening Chronicity:  New Relieved by:  Nothing Worsened by:  Nothing Ineffective treatments:  None tried Associated symptoms: no cough and no fever   Risk factors: no high cholesterol and no hypertension        Home Medications Prior to Admission medications   Medication Sig Start Date End Date Taking? Authorizing Provider  benzonatate (TESSALON) 100 MG capsule Take 1 capsule (100 mg total) by mouth every 8 (eight) hours. 07/23/21   Hughie Closs, PA-C  fluticasone (FLONASE) 50 MCG/ACT nasal spray Place 2 sprays into both nostrils daily. 07/23/21   Hughie Closs, PA-C  ibuprofen (ADVIL) 600 MG tablet Take 1 tablet (600 mg total) by mouth every 6 (six) hours as needed for mild pain. 08/28/22   Teodora Medici, FNP      Allergies    Penicillins    Review of Systems   Review of Systems  Constitutional:  Negative for fever.  Respiratory:  Negative for cough.   Cardiovascular:  Positive for chest pain.  All other systems reviewed and are negative.   Physical Exam Updated Vital Signs BP (!) 140/97 (BP Location: Right Arm)   Pulse 93   Temp 98 F (36.7 C)   Resp 15   Ht '5\' 10"'$  (1.778 m)   Wt 129.3 kg   SpO2 100%   BMI 40.89 kg/m  Physical Exam Vitals and nursing note reviewed.  Constitutional:      Appearance: He is well-developed.  HENT:     Head: Normocephalic.  Cardiovascular:     Rate and Rhythm:  Normal rate and regular rhythm.     Heart sounds: Normal heart sounds.  Pulmonary:     Effort: Pulmonary effort is normal.  Abdominal:     General: There is no distension.     Palpations: Abdomen is soft.  Musculoskeletal:        General: Normal range of motion.     Cervical back: Normal range of motion.  Skin:    General: Skin is warm.  Neurological:     Mental Status: He is alert and oriented to person, place, and time.  Psychiatric:        Mood and Affect: Mood normal.    ED Results / Procedures / Treatments   Labs (all labs ordered are listed, but only abnormal results are displayed) Labs Reviewed  COMPREHENSIVE METABOLIC PANEL - Abnormal; Notable for the following components:      Result Value   Total Protein 8.2 (*)    Total Bilirubin 1.4 (*)    All other components within normal limits  CBC WITH DIFFERENTIAL/PLATELET  TROPONIN I (HIGH SENSITIVITY)  TROPONIN I (HIGH SENSITIVITY)    EKG None  Radiology DG Chest 2 View  Result Date: 01/25/2023 CLINICAL DATA:  Chest pain. EXAM: CHEST - 2 VIEW COMPARISON:  None Available. FINDINGS: The heart size and mediastinal contours  are within normal limits. Both lungs are clear. The visualized skeletal structures are unremarkable. IMPRESSION: No active cardiopulmonary disease. Electronically Signed   By: Dorise Bullion III M.D.   On: 01/25/2023 16:21    Procedures Procedures    Medications Ordered in ED Medications - No data to display  ED Course/ Medical Decision Making/ A&P                             Medical Decision Making Pt complains of pain in his chest that resolved   Amount and/or Complexity of Data Reviewed External Data Reviewed: notes.    Details: Prior urgent care note and EKG reviewed  Labs: ordered. Decision-making details documented in ED Course.    Details: Labs ordered reviewed and interpreted troponin is negative x 2 Radiology: ordered and independent interpretation performed. Decision-making  details documented in ED Course.    Details: X-ray shows no acute abnormality ECG/medicine tests: ordered and independent interpretation performed. Decision-making details documented in ED Course.    Details: KG normal sinus no acute findings  Risk OTC drugs. Risk Details: Patient advised to follow-up with primary care.  Patient is given a note for work tomorrow.           Final Clinical Impression(s) / ED Diagnoses Final diagnoses:  Chest wall pain    Rx / DC Orders ED Discharge Orders     None      An After Visit Summary was printed and given to the patient.    Sidney Ace 01/25/23 2224    Dorie Rank, MD 01/26/23 1154

## 2023-01-25 NOTE — ED Provider Notes (Signed)
Patient seen briefly in triage.  When he was walking after lunch at work today about 20 minutes ago, he began having severe chest pain and tightness.  It was all across his central anterior chest.  He also felt dizzy and had blurry vision with it.  When he sat down in the car to come here it improved and has resolved at this moment.  EKG here shows signs of strain in several of the leads.  I do not see any ST segment elevation or depression.  Vitals are overall reassuring though he is mildly tachycardic at 110 and his blood pressure is 140/77.  He does not smoke and he does not have a significant history of heart disease in his family.  Still, with the character of the pain in the abnormalities on the EKG, I would like him to go be evaluated in the emergency room with a higher level of evaluation and treatment then we can provide here in the urgent care setting.  He is agreeable and will go by private car with a friend who is driving    Barrett Henle, MD 01/25/23 [phone]

## 2023-01-25 NOTE — ED Triage Notes (Addendum)
Pt c/o non radiating centralized chest tightness this afternoon that subsided almost completely after approx 5 minutes. Endorses shortness of breath during episode.

## 2023-01-25 NOTE — ED Provider Triage Note (Signed)
Emergency Medicine Provider Triage Evaluation Note  Reginald Morris , a 32 y.o. male  was evaluated in triage.  Pt complains of chest pain.  Patient works that he was feeling some chest pain and pressure earlier today after lunch while he was walking around at work which progressed to become severe and felt tight across his chest.  Felt that his pain was primarily in central chest without radiation into any limb or extremity.  At that time he also felt some dizziness and blurriness and was able to sit down to get some relief in symptoms.  States that pain is largely resolved at this point.  No associated nausea, diaphoresis, fevers.  No prior history of any cardiac abnormalities or heart arrhythmias.  Review of Systems  Positive: As above Negative: As above  Physical Exam  BP (!) 153/90 (BP Location: Right Arm)   Pulse 99   Temp 98.8 F (37.1 C)   Resp 18   Ht '5\' 10"'$  (1.778 m)   Wt 129.3 kg   SpO2 100%   BMI 40.89 kg/m  Gen:   Awake, no distress   Resp:  Normal effort  MSK:   Moves extremities without difficulty  Other:    Medical Decision Making  Medically screening exam initiated at 3:53 PM.  Appropriate orders placed.  Reginald Morris was informed that the remainder of the evaluation will be completed by another provider, this initial triage assessment does not replace that evaluation, and the importance of remaining in the ED until their evaluation is complete.     Luvenia Heller, PA-C 01/25/23 1555
